# Patient Record
Sex: Male | Born: 1990 | Race: White | Hispanic: No | Smoking: Never smoker
Health system: Southern US, Community
[De-identification: ages and names within clinical notes are randomized; demographics above are authoritative.]

## PROBLEM LIST (undated history)

## (undated) ENCOUNTER — Emergency Department (HOSPITAL_COMMUNITY): Payer: BLUE CROSS/BLUE SHIELD

## (undated) DIAGNOSIS — J45909 Unspecified asthma, uncomplicated: Secondary | ICD-10-CM

## (undated) DIAGNOSIS — D649 Anemia, unspecified: Secondary | ICD-10-CM

## (undated) DIAGNOSIS — K219 Gastro-esophageal reflux disease without esophagitis: Secondary | ICD-10-CM

## (undated) HISTORY — PX: ELBOW SURGERY: SHX618

---
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
## 2017-08-26 ENCOUNTER — Other Ambulatory Visit: Payer: Self-pay | Admitting: Sports Medicine

## 2017-08-26 DIAGNOSIS — M2201 Recurrent dislocation of patella, right knee: Secondary | ICD-10-CM

## 2017-09-11 ENCOUNTER — Ambulatory Visit
Admission: RE | Admit: 2017-09-11 | Discharge: 2017-09-11 | Disposition: A | Discharge status: Home / Self Care | Payer: BLUE CROSS/BLUE SHIELD | Source: Ambulatory Visit | Attending: Sports Medicine | Admitting: Sports Medicine

## 2017-09-11 DIAGNOSIS — M2201 Recurrent dislocation of patella, right knee: Secondary | ICD-10-CM | POA: Diagnosis not present

## 2017-10-07 ENCOUNTER — Encounter: Payer: Self-pay | Admitting: *Deleted

## 2017-10-07 ENCOUNTER — Other Ambulatory Visit: Payer: Self-pay

## 2017-10-08 ENCOUNTER — Ambulatory Visit: Payer: Self-pay

## 2017-10-13 ENCOUNTER — Encounter
Admission: RE | Disposition: A | Discharge status: Home / Self Care | Payer: Self-pay | Source: Ambulatory Visit | Attending: Orthopedic Surgery

## 2017-10-13 ENCOUNTER — Ambulatory Visit: Payer: BLUE CROSS/BLUE SHIELD | Admitting: Anesthesiology

## 2017-10-13 ENCOUNTER — Ambulatory Visit
Admission: RE | Admit: 2017-10-13 | Discharge: 2017-10-13 | Disposition: A | Discharge status: Home / Self Care | Payer: BLUE CROSS/BLUE SHIELD | Source: Ambulatory Visit | Attending: Orthopedic Surgery | Admitting: Orthopedic Surgery

## 2017-10-13 DIAGNOSIS — M1711 Unilateral primary osteoarthritis, right knee: Secondary | ICD-10-CM | POA: Diagnosis not present

## 2017-10-13 DIAGNOSIS — M222X1 Patellofemoral disorders, right knee: Secondary | ICD-10-CM | POA: Diagnosis not present

## 2017-10-13 DIAGNOSIS — M2410 Other articular cartilage disorders, unspecified site: Secondary | ICD-10-CM | POA: Diagnosis not present

## 2017-10-13 DIAGNOSIS — M2201 Recurrent dislocation of patella, right knee: Secondary | ICD-10-CM | POA: Insufficient documentation

## 2017-10-13 DIAGNOSIS — Z9889 Other specified postprocedural states: Secondary | ICD-10-CM | POA: Diagnosis not present

## 2017-10-13 HISTORY — DX: Unspecified asthma, uncomplicated: J45.909

## 2017-10-13 HISTORY — PX: KNEE ARTHROSCOPY WITH LATERAL RELEASE: SHX5649

## 2017-10-13 HISTORY — PX: CHONDROPLASTY: SHX5177

## 2017-10-13 HISTORY — DX: Anemia, unspecified: D64.9

## 2017-10-13 HISTORY — DX: Gastro-esophageal reflux disease without esophagitis: K21.9

## 2017-10-13 SURGERY — ARTHROSCOPY, KNEE, WITH LATERAL RETINACULUM RELEASE
Anesthesia: Regional | Laterality: Right

## 2017-10-13 MED ORDER — FENTANYL CITRATE (PF) 100 MCG/2ML IJ SOLN: 25.0000 ug | INTRAMUSCULAR | Status: DC | PRN

## 2017-10-13 MED ORDER — HYDROCODONE-ACETAMINOPHEN 5-325 MG PO TABS: 1.0000 | ORAL_TABLET | ORAL | 0 refills | Status: DC | PRN

## 2017-10-13 MED ORDER — BUPIVACAINE HCL 0.5 % IJ SOLN
INTRAMUSCULAR | Status: DC | PRN
  Administered 2017-10-13: 30 mL

## 2017-10-13 MED ORDER — ASPIRIN EC 325 MG PO TBEC: 325.0000 mg | DELAYED_RELEASE_TABLET | Freq: Every day | ORAL | 0 refills | Status: AC

## 2017-10-13 MED ORDER — ONDANSETRON HCL 4 MG/2ML IJ SOLN
4.0000 mg | Freq: Once | INTRAMUSCULAR | Status: AC | PRN
  Administered 2017-10-13: 4 mg via INTRAVENOUS

## 2017-10-13 MED ORDER — DEXAMETHASONE SODIUM PHOSPHATE 4 MG/ML IJ SOLN
INTRAMUSCULAR | Status: DC | PRN
  Administered 2017-10-13: 4 mg via INTRAVENOUS

## 2017-10-13 MED ORDER — PROPOFOL 10 MG/ML IV BOLUS
INTRAVENOUS | Status: DC | PRN
  Administered 2017-10-13: 30 mg via INTRAVENOUS
  Administered 2017-10-13: 170 mg via INTRAVENOUS

## 2017-10-13 MED ORDER — LIDOCAINE HCL (CARDIAC) 20 MG/ML IV SOLN
INTRAVENOUS | Status: DC | PRN
  Administered 2017-10-13: 30 mg via INTRAVENOUS

## 2017-10-13 MED ORDER — METOCLOPRAMIDE HCL 5 MG/ML IJ SOLN
10.0000 mg | Freq: Once | INTRAMUSCULAR | Status: AC
  Administered 2017-10-13: 10 mg via INTRAVENOUS

## 2017-10-13 MED ORDER — DEXTROSE 5 % IV SOLN
2000.0000 mg | Freq: Once | INTRAVENOUS | Status: AC
  Administered 2017-10-13: 2000 mg via INTRAVENOUS

## 2017-10-13 MED ORDER — GLYCOPYRROLATE 0.2 MG/ML IJ SOLN
INTRAMUSCULAR | Status: DC | PRN
  Administered 2017-10-13: 0.1 mg via INTRAVENOUS

## 2017-10-13 MED ORDER — SUCCINYLCHOLINE CHLORIDE 20 MG/ML IJ SOLN
INTRAMUSCULAR | Status: DC | PRN
  Administered 2017-10-13: 100 mg via INTRAVENOUS

## 2017-10-13 MED ORDER — FENTANYL CITRATE (PF) 100 MCG/2ML IJ SOLN
INTRAMUSCULAR | Status: DC | PRN
  Administered 2017-10-13: 100 ug via INTRAVENOUS
  Administered 2017-10-13: 25 ug via INTRAVENOUS

## 2017-10-13 MED ORDER — ACETAMINOPHEN 10 MG/ML IV SOLN
1000.0000 mg | Freq: Once | INTRAVENOUS | Status: DC | PRN
  Administered 2017-10-13: 1000 mg via INTRAVENOUS

## 2017-10-13 MED ORDER — ONDANSETRON 4 MG PO TBDP: 4.0000 mg | ORAL_TABLET | Freq: Three times a day (TID) | ORAL | 0 refills | Status: DC | PRN

## 2017-10-13 MED ORDER — IBUPROFEN 800 MG PO TABS: 800.0000 mg | ORAL_TABLET | Freq: Three times a day (TID) | ORAL | 0 refills | Status: AC

## 2017-10-13 MED ORDER — LIDOCAINE-EPINEPHRINE 1 %-1:100000 IJ SOLN
INTRAMUSCULAR | Status: DC | PRN
  Administered 2017-10-13: 30 mL

## 2017-10-13 MED ORDER — OXYCODONE HCL 5 MG PO TABS: 5.0000 mg | ORAL_TABLET | Freq: Once | ORAL | Status: DC | PRN

## 2017-10-13 MED ORDER — OXYCODONE HCL 5 MG/5ML PO SOLN: 5.0000 mg | Freq: Once | ORAL | Status: DC | PRN

## 2017-10-13 MED ORDER — LACTATED RINGERS IV SOLN
1000.0000 mL | INTRAVENOUS | Status: DC
  Administered 2017-10-13: 1000 mL via INTRAVENOUS

## 2017-10-13 MED ORDER — LACTATED RINGERS IV SOLN: INTRAVENOUS | Status: DC

## 2017-10-13 MED ORDER — ONDANSETRON HCL 4 MG/2ML IJ SOLN
INTRAMUSCULAR | Status: DC | PRN
  Administered 2017-10-13: 4 mg via INTRAVENOUS

## 2017-10-13 SURGICAL SUPPLY — 47 items
ADAPTER IRRIG TUBE 2 SPIKE SOL (ADAPTER) ×6 IMPLANT
BLADE SURG 15 STRL LF DISP TIS (BLADE) ×1 IMPLANT
BLADE SURG 15 STRL SS (BLADE) ×2
BLADE SURG SZ11 CARB STEEL (BLADE) ×3 IMPLANT
BNDG ESMARK 6X12 TAN STRL LF (GAUZE/BANDAGES/DRESSINGS) IMPLANT
BUR RADIUS 3.5 (BURR) IMPLANT
BUR RADIUS 4.0X18.5 (BURR) ×3 IMPLANT
CHLORAPREP W/TINT 26ML (MISCELLANEOUS) ×3 IMPLANT
CLOSURE WOUND 1/2 X4 (GAUZE/BANDAGES/DRESSINGS) ×1
COOLER POLAR GLACIER W/PUMP (MISCELLANEOUS) ×3 IMPLANT
COVER LIGHT HANDLE UNIVERSAL (MISCELLANEOUS) ×6 IMPLANT
CUFF TOURN SGL QUICK 24 (TOURNIQUET CUFF) ×2
CUFF TOURN SGL QUICK 30 (MISCELLANEOUS)
CUFF TOURN SGL QUICK 34 (TOURNIQUET CUFF) ×2
CUFF TRNQT CYL 24X4X40X1 (TOURNIQUET CUFF) ×1 IMPLANT
CUFF TRNQT CYL 34X4X40X1 (TOURNIQUET CUFF) ×1 IMPLANT
CUFF TRNQT CYL LO 30X4X (MISCELLANEOUS) IMPLANT
DECANTER SPIKE VIAL GLASS SM (MISCELLANEOUS) ×3 IMPLANT
DRAPE IMP U-DRAPE 54X76 (DRAPES) ×3 IMPLANT
GAUZE SPONGE 4X4 12PLY STRL (GAUZE/BANDAGES/DRESSINGS) ×3 IMPLANT
GLOVE BIO SURGEON STRL SZ7.5 (GLOVE) ×3 IMPLANT
GLOVE BIOGEL PI IND STRL 8 (GLOVE) ×1 IMPLANT
GLOVE BIOGEL PI INDICATOR 8 (GLOVE) ×2
GOWN STRL REUS W/ TWL LRG LVL3 (GOWN DISPOSABLE) ×1 IMPLANT
GOWN STRL REUS W/TWL LRG LVL3 (GOWN DISPOSABLE) ×5 IMPLANT
IV LACTATED RINGER IRRG 3000ML (IV SOLUTION) ×12
IV LR IRRIG 3000ML ARTHROMATIC (IV SOLUTION) ×6 IMPLANT
KIT RM TURNOVER STRD PROC AR (KITS) ×3 IMPLANT
MAT BLUE FLOOR 46X72 FLO (MISCELLANEOUS) ×3 IMPLANT
NEEDLE HYPO 21X1.5 SAFETY (NEEDLE) ×3 IMPLANT
NEPTUNE MANIFOLD (MISCELLANEOUS) ×3 IMPLANT
PACK ARTHROSCOPY KNEE (MISCELLANEOUS) ×3 IMPLANT
PAD ABD DERMACEA PRESS 5X9 (GAUZE/BANDAGES/DRESSINGS) ×6 IMPLANT
PAD WRAPON POLAR KNEE (MISCELLANEOUS) ×1 IMPLANT
PADDING CAST 4IN STRL (MISCELLANEOUS) ×2
PADDING CAST BLEND 4X4 STRL (MISCELLANEOUS) ×1 IMPLANT
SET TUBE SUCT SHAVER OUTFL 24K (TUBING) ×3 IMPLANT
SET TUBE TIP INTRA-ARTICULAR (MISCELLANEOUS) ×3 IMPLANT
STRIP CLOSURE SKIN 1/2X4 (GAUZE/BANDAGES/DRESSINGS) ×2 IMPLANT
SUT ETHILON 3-0 FS-10 30 BLK (SUTURE) ×3
SUTURE EHLN 3-0 FS-10 30 BLK (SUTURE) ×1 IMPLANT
TOWEL OR 17X26 4PK STRL BLUE (TOWEL DISPOSABLE) ×6 IMPLANT
TUBING ARTHRO INFLOW-ONLY STRL (TUBING) ×3 IMPLANT
VERICEL CARTILAGE BIOPSY KIT ×3 IMPLANT
WAND HAND CNTRL MULTIVAC 50 (MISCELLANEOUS) IMPLANT
WAND HAND CNTRL MULTIVAC 90 (MISCELLANEOUS) ×3 IMPLANT
WRAPON POLAR PAD KNEE (MISCELLANEOUS) ×3

## 2017-10-13 NOTE — Anesthesia Procedure Notes (Signed)
Procedure Name: Intubation Date/Time: 10/13/2017 11:39 AM Performed by: Cameron Ali, CRNA Pre-anesthesia Checklist: Patient identified, Emergency Drugs available, Suction available, Patient being monitored and Timeout performed Patient Re-evaluated:Patient Re-evaluated prior to induction Oxygen Delivery Method: Circle system utilized Preoxygenation: Pre-oxygenation with 100% oxygen Induction Type: IV induction, Rapid sequence and Cricoid Pressure applied Laryngoscope Size: Mac and 3 Grade View: Grade II Tube type: Oral Tube size: 7.5 mm Number of attempts: 1 Placement Confirmation: ETT inserted through vocal cords under direct vision,  positive ETCO2 and breath sounds checked- equal and bilateral Tube secured with: Tape Dental Injury: Teeth and Oropharynx as per pre-operative assessment

## 2017-10-13 NOTE — H&P (Signed)
Paper H&P to be scanned into permanent record. H&P reviewed. No significant changes noted.  

## 2017-10-13 NOTE — OR Nursing (Signed)
[employer] rep. For Vericel,  Cartilage Biopsy Kit Patient consented.

## 2017-10-13 NOTE — Anesthesia Postprocedure Evaluation (Signed)
Anesthesia Post Note  Patient: Kyle [name]  Procedure(s) Performed: KNEE ARTHROSCOPY WITH LATERAL RELEASE POSSIBLE MICROFRACTURE AND MACI BIOPSY (Right ) CHONDROPLASTY (Right )  Patient location during evaluation: PACU Anesthesia Type: Regional Level of consciousness: awake and alert, oriented and patient cooperative Pain management: pain level controlled Vital Signs Assessment: post-procedure vital signs reviewed and stable Respiratory status: spontaneous breathing, nonlabored ventilation and respiratory function stable Cardiovascular status: blood pressure returned to baseline and stable Postop Assessment: adequate PO intake Anesthetic complications: no    Reed BreechAndrea Bert Ptacek

## 2017-10-13 NOTE — Discharge Instructions (Signed)
Arthroscopic Knee Surgery  Post-Op Instructions  1. Bracing or crutches: Crutches will be provided at the time of discharge from the surgery center if you do not already have them.  2. Ice: You may be provided with a device [facility]) that allows you to ice the affected area effectively. Otherwise you can ice manually.   3. Driving:  Plan on not driving for at least one week. Please note that you are advised NOT to drive while taking narcotic pain medications as you may be impaired and unsafe to drive.  4. Activity: Ankle pumps several times an hour while awake to prevent blood clots. Weight bearing: as tolerated. Use crutches for as needed (usually ~1 week or less) until pain allows you to ambulate without a limp. Bending and straightening the knee is unlimited. Elevate knee above heart level as much as possible for one week. Avoid standing more than 5 minutes (consecutively) for the first week.  Avoid long distance travel for 2 weeks.  5. Medications:  - You have been provided a prescription for narcotic pain medicine. After surgery, take 1-2 narcotic tablets every 4 hours if needed for severe pain.  - A prescription for anti-nausea medication will be provided in case the narcotic medicine causes nausea - take 1 tablet every 6 hours only if nauseated.  - Take ibuprofen 800 mg every 8 hours WITH food to reduce post-operative knee swelling. DO NOT STOP IBUPROFEN POST-OP UNTIL INSTRUCTED TO DO SO at first post-op office visit (10-14 days after surgery). However, please discontinue if you have any abdominal discomfort after taking this.  - Take enteric coated aspirin 325 mg once daily for 2 weeks to prevent blood clots.   6. Bandages: The physical therapist should change the bandages at the first post-op appointment. If needed, the dressing supplies have been provided to you.  7. Physical Therapy: 1-2 times per week for 6 weeks. Therapy typically starts on post operative Day 3 or 4. You  have been provided an order for physical therapy. The therapist will provide home exercises.  8. Work: May return to full work usually around 2 weeks after 1st post-operative visit. May do light duty/desk job in approximately 1-2 weeks when off of narcotics, pain is well-controlled, and swelling has decreased. Labor intensive jobs may require 4-6 weeks to return.    9. Post-Op Appointments: Your first post-op appointment will be with Dr. Allena Katz in approximately 2 weeks time.   If you find that they have not been scheduled please call the Orthopaedic Appointment front desk at [phone].       General Anesthesia, Adult, Care After These instructions provide you with information about caring for yourself after your procedure. Your health care provider may also give you more specific instructions. Your treatment has been planned according to current medical practices, but problems sometimes occur. Call your health care provider if you have any problems or questions after your procedure. What can I expect after the procedure? After the procedure, it is common to have:  Vomiting.  A sore throat.  Mental slowness.  It is common to feel:  Nauseous.  Cold or shivery.  Sleepy.  Tired.  Sore or achy, even in parts of your body where you did not have surgery.  Follow these instructions at home: For at least 24 hours after the procedure:  Do not: ? Participate in activities where you could fall or become injured. ? Drive. ? Use heavy machinery. ? Drink alcohol. ? Take sleeping pills or  medicines that cause drowsiness. ? Make important decisions or sign legal documents. ? Take care of children on your own.  Rest. Eating and drinking  If you vomit, drink water, juice, or soup when you can drink without vomiting.  Drink enough fluid to keep your urine clear or pale yellow.  Make sure you have little or no nausea before eating solid foods.  Follow the diet recommended  by your health care provider. General instructions  Have a responsible adult stay with you until you are awake and alert.  Return to your normal activities as told by your health care provider. Ask your health care provider what activities are safe for you.  Take over-the-counter and prescription medicines only as told by your health care provider.  If you smoke, do not smoke without supervision.  Keep all follow-up visits as told by your health care provider. This is important. Contact a health care provider if:  You continue to have nausea or vomiting at home, and medicines are not helpful.  You cannot drink fluids or start eating again.  You cannot urinate after 8-12 hours.  You develop a skin rash.  You have fever.  You have increasing redness at the site of your procedure. Get help right away if:  You have difficulty breathing.  You have chest pain.  You have unexpected bleeding.  You feel that you are having a life-threatening or urgent problem. This information is not intended to replace advice given to you by your health care provider. Make sure you discuss any questions you have with your health care provider. Document Released: 12/01/2000 Document Revised: 01/28/2016 Document Reviewed: 08/09/2015 Elsevier Interactive Patient Education  [employer]2018 [employer]

## 2017-10-13 NOTE — Progress Notes (Signed)
Assisted Reed BreechAndrea Mazzoni, ANMD with right, ultrasound guided, adductor canal block. Side rails up, monitors on throughout procedure. See vital signs in flow sheet. Tolerated Procedure well.

## 2017-10-13 NOTE — Transfer of Care (Signed)
Immediate Anesthesia Transfer of Care Note  Patient: Kyle [name]  Procedure(s) Performed: KNEE ARTHROSCOPY WITH LATERAL RELEASE POSSIBLE MICROFRACTURE AND MACI BIOPSY (Right ) REMOVAL OF LOOSE BODY (Right ) CHONDROPLASTY (Right ) RECONSTRUCTION MEDIAL PATELLOFERMORAL LIGAMENT USING SENITENDINESUS ALLEGRAFT POSSIBLE OSTEOCHONDRAL FRAGMENT FIXATION (Right )  Patient Location: PACU  Anesthesia Type: General, Regional  Level of Consciousness: awake, alert  and patient cooperative  Airway and Oxygen Therapy: Patient Spontanous Breathing and Patient connected to supplemental oxygen  Post-op Assessment: Post-op Vital signs reviewed, Patient's Cardiovascular Status Stable, Respiratory Function Stable, Patent Airway and No signs of Nausea or vomiting  Post-op Vital Signs: Reviewed and stable  Complications: No apparent anesthesia complications

## 2017-10-13 NOTE — Anesthesia Procedure Notes (Signed)
Anesthesia Regional Block: Adductor canal block   Pre-Anesthetic Checklist: ,, timeout performed, Correct Patient, Correct Site, Correct Laterality, Correct Procedure, Correct Position, site marked, Risks and benefits discussed,  Surgical consent,  Pre-op evaluation,  At surgeon's request and post-op pain management  Laterality: Right  Prep: chloraprep       Needles:  Injection technique: Single-shot  Needle Type: Echogenic Needle     Needle Length: 9cm  Needle Gauge: 21     Additional Needles:   Procedures:,,,, ultrasound used (permanent image in chart),,,,  Narrative:  Start time: 10/13/2017 10:21 AM End time: 10/13/2017 10:28 AM Injection made incrementally with aspirations every 5 mL.  Performed by: Personally  Anesthesiologist: Reed BreechMazzoni, Cecile Gillispie, MD  Additional Notes: Functioning IV was confirmed and monitors applied. Ultrasound guidance: relevant anatomy identified, needle position confirmed, local anesthetic spread visualized around nerve(s)., vascular puncture avoided.  Image printed for medical record.  Negative aspiration and no paresthesias; incremental administration of local anesthetic for a total of 20ml ropivacaine 0.5%. The patient tolerated the procedure well. Vitals signes recorded in RN notes.

## 2017-10-13 NOTE — Anesthesia Preprocedure Evaluation (Signed)
Anesthesia Evaluation  Patient identified by MRN, date of birth, ID band Patient awake    Reviewed: Allergy & Precautions, NPO status , Patient's Chart, lab work & pertinent test results  History of Anesthesia Complications Negative for: history of anesthetic complications  Airway Mallampati: I  TM Distance: >3 FB Neck ROM: Full    Dental no notable dental hx.    Pulmonary asthma (childhood) ,  Snoring    Pulmonary exam normal breath sounds clear to auscultation       Cardiovascular Exercise Tolerance: Good negative cardio ROS Normal cardiovascular exam Rhythm:Regular Rate:Normal     Neuro/Psych negative neurological ROS     GI/Hepatic GERD  Poorly Controlled,  Endo/Other  negative endocrine ROS  Renal/GU negative Renal ROS     Musculoskeletal   Abdominal   Peds  Hematology  (+) Blood dyscrasia, anemia ,   Anesthesia Other Findings   Reproductive/Obstetrics                             Anesthesia Physical Anesthesia Plan  ASA: II  Anesthesia Plan: General and Regional   Post-op Pain Management:  Regional for Post-op pain and GA combined w/ Regional for post-op pain   Induction: Intravenous  PONV Risk Score and Plan: 2 and Dexamethasone and Ondansetron  Airway Management Planned: Oral ETT  Additional Equipment:   Intra-op Plan:   Post-operative Plan: Extubation in OR  Informed Consent: I have reviewed the patients History and Physical, chart, labs and discussed the procedure including the risks, benefits and alternatives for the proposed anesthesia with the patient or authorized representative who has indicated his/her understanding and acceptance.     Plan Discussed with: CRNA  Anesthesia Plan Comments: (RSI for active GERD symptoms)        Anesthesia Quick Evaluation

## 2017-10-13 NOTE — Op Note (Signed)
Operative Note   SURGERY DATE: 10/13/2017  PRE-OP DIAGNOSIS:  1. 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 Right patella lateral tilt  POST-OP DIAGNOSIS:  1. 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 Right patella lateral tilt  PROCEDURES:  1. Right patella chondroplasty 2. Right knee autologous chondrocyte biopsy 3. Right arthroscopic lateral release  SURGEON: Rosealee AlbeeSunny H. Getsemani Lindon, MD  ANESTHESIA: Gen  ESTIMATED BLOOD LOSS:minimal  TOTAL IV FLUIDS: per anesthesia  INDICATION(S):  Kyle [name] is a 27 y.o. male with a history of six prior patellar dislocations. The most recent one occurred on 08/16/17. This one has been the most severe in terms of swelling and pain as well as difficulty with reduction. Imaging showed osteochondral fragments about the patella as well as disrupted MPFL and cartilage defect of medial patella. We had planned for R knee arthroscopy, lateral release, MPFL reconstruction, and addressing of the cartilage lesion with microfracture vs. Osteochondral fragment fixation. We did discuss obtaining MACI biopsy and consent for this was also obtained. Additionally, preoperatively, we discussed that if his cartilage lesion was more extensive, we would plan to do a two stage procedure with MACI implantation and MPFL reconstruction as the second stage.  After discussion of risks, benefits, and alternatives to surgery, the patient elected to proceed with this plan.   OPERATIVE FINDINGS:   Examination under anesthesia:A careful examination under anesthesia was performed. Passive range of motion was: Hyperextension: 7. Extension: 0. Flexion: 140. Lachman: normal. Pivot Shift: normal. Posterior drawer: normal. Varus stability in full extension: normal. Varus stability in 30 degrees of flexion: normal. Valgus stability in full extension: normal. Valgus stability in 30 degrees of flexion: normal. Patella  with lateral tilt and 3+ lateral quadrants of motion with 2+ medial quadrant of motion. He also had a positive "J" sign.   Intra-operative findings:A thorough arthroscopic examination of the knee was performed. The findings are: 1. Suprapatellar pouch: Normal with small loose osteochondral fragment. 2. Undersurface of median ridge: Normal 3. Medial patellar facet: Grade 4 degenerative changes ~4x225mm on medial border. Additional area of ~1.5x~1.2cm of Grade 3-4 cartilage softening over apex of patella extending medially and located centrally in the superior/inferior direction. 4. Lateral patellar facet: Significant lateral overhang of lateral patella compared to lateral border of trochlea.  5. Trochlea: Normal cartilage, abnormal tracking of patella 6. Lateral gutter/popliteus tendon: Normal 7. Hoffa's fat pad: Inflamed 8. Medial gutter/plica: Normal 9. ACL: Normal 10. PCL: Normal 11. Medial meniscus: Normal 12. Medial compartment cartilage: Normal 13. Lateral meniscus: Normal 14. Lateral compartment cartilage: Normal  OPERATIVE REPORT:   I identified Kyle BraceJacob Beesonin the pre-operative holding area. I marked theoperativeknee with my initials. I reviewed the risks and benefits of the proposed surgical intervention and the patient (and/or patient's guardian) wished to proceed. The patient was transferred to the operative suite and placed in the supine position with all bony prominences padded. Anesthesia was administered. Appropriate IV antibioticswere administered prior to incision. The extremity was then prepped and draped in standard fashion. A time out was performed confirming the correct extremity, correct patient, and correct procedure.  Arthroscopy portals were marked. Local anesthetic was injected to the planned portal sites. The anterolateral portalwasestablished with an 11blade. The arthroscope was placed in the anterolateral portal and theninto the suprapatellar pouch. A  diagnostic knee scope was completed with the above findings.   Next the medial portal was established under needle localization. The medial patellar border cartilage defect was identified.  A chondroplasty  of the medial border of the patella was performed such that there were stable cartilage edges without any loose fragments of cartilage. The loose body was identified and removed with a shaver. The cartilage surface of the patella was probed and the aforementioned cartilage defect was observed. Given the extent and severity of cartilage damage in the central weightbearing portion, I elected to perform a MACI implantation and MPFL reconstruction as a second stage procedure as I had discussed with the patient prior to surgery.   I then placed an Arthrocare wand into the joint and performed a lateral release of the lateral patellar retinaculum until muscle fibers were visualized. This was carried down from approximately the superior pole of the patella to near the portal entrance site. The patella now sat more centrally in the groove and the lateral overhang was improved. The tourniquet was let down and no major bleeding was noted.  Arthroscopic fluid was removed from the joint.  The portals were closed with 3-0 Nylon suture. Sterile dressings included Xeroform, 4x4s, Sof-Rol, and Bias wrap. A Polarcare was placed.  The patient was then awakened and taken to the PACU hemodynamically stable without complication.   POSTOPERATIVE PLAN: The patient will be discharged home today once they meet PACU criteria. Aspirin 325 mg daily was prescribed for 2 weeks for DVT prophylaxis. Physical therapy will start on POD#3-4.Weight-bearing as tolerated. They will follow up in 2 weeks per protocol.   This will serve as stage 1 (MACI biopsy, chondroplasty and lateral release) of a two stage procedure. Second stage will consist of Right patella MACI implantation and concurrent MPFL reconstruction. We will plan to do  this in ~6 weeks after confirmation that autologous chondrocytes can be grown and is complete.

## 2017-11-16 ENCOUNTER — Other Ambulatory Visit: Payer: Self-pay

## 2017-11-16 ENCOUNTER — Encounter: Payer: Self-pay | Admitting: *Deleted

## 2017-12-03 ENCOUNTER — Encounter
Admission: RE | Disposition: A | Discharge status: Home / Self Care | Payer: Self-pay | Source: Ambulatory Visit | Attending: Orthopedic Surgery

## 2017-12-03 ENCOUNTER — Ambulatory Visit: Payer: BLUE CROSS/BLUE SHIELD | Admitting: Anesthesiology

## 2017-12-03 ENCOUNTER — Ambulatory Visit
Admission: RE | Admit: 2017-12-03 | Discharge: 2017-12-03 | Disposition: A | Discharge status: Home / Self Care | Payer: BLUE CROSS/BLUE SHIELD | Source: Ambulatory Visit | Attending: Orthopedic Surgery | Admitting: Orthopedic Surgery

## 2017-12-03 ENCOUNTER — Ambulatory Visit: Payer: BLUE CROSS/BLUE SHIELD | Attending: Orthopedic Surgery

## 2017-12-03 DIAGNOSIS — M948X6 Other specified disorders of cartilage, lower leg: Secondary | ICD-10-CM | POA: Insufficient documentation

## 2017-12-03 DIAGNOSIS — M25561 Pain in right knee: Secondary | ICD-10-CM | POA: Diagnosis present

## 2017-12-03 DIAGNOSIS — Z419 Encounter for procedure for purposes other than remedying health state, unspecified: Secondary | ICD-10-CM | POA: Diagnosis not present

## 2017-12-03 DIAGNOSIS — M2351 Chronic instability of knee, right knee: Secondary | ICD-10-CM | POA: Insufficient documentation

## 2017-12-03 HISTORY — PX: KNEE ARTHROSCOPY: SHX127

## 2017-12-03 SURGERY — ARTHROSCOPY, KNEE
Anesthesia: General | Site: Knee | Laterality: Right | Wound class: "Clean "

## 2017-12-03 MED ORDER — CEFAZOLIN SODIUM-DEXTROSE 2-4 GM/100ML-% IV SOLN
2.0000 g | Freq: Once | INTRAVENOUS | Status: AC
  Administered 2017-12-03: 2 g via INTRAVENOUS

## 2017-12-03 MED ORDER — LIDOCAINE HCL (CARDIAC) 20 MG/ML IV SOLN
INTRAVENOUS | Status: DC | PRN
  Administered 2017-12-03: 40 mg via INTRAVENOUS

## 2017-12-03 MED ORDER — LACTATED RINGERS IV SOLN
INTRAVENOUS | Status: DC
  Administered 2017-12-03 (×2): via INTRAVENOUS

## 2017-12-03 MED ORDER — SODIUM CHLORIDE 0.9 % IJ SOLN
INTRAMUSCULAR | Status: DC | PRN
  Administered 2017-12-03: 13:00:00

## 2017-12-03 MED ORDER — PROPOFOL 10 MG/ML IV BOLUS
INTRAVENOUS | Status: DC | PRN
  Administered 2017-12-03: 300 mg via INTRAVENOUS

## 2017-12-03 MED ORDER — BUPIVACAINE HCL (PF) 0.25 % IJ SOLN
INTRAMUSCULAR | Status: DC | PRN
  Administered 2017-12-03: 10 mL

## 2017-12-03 MED ORDER — OXYCODONE HCL 5 MG/5ML PO SOLN: 5.0000 mg | Freq: Once | ORAL | Status: AC | PRN

## 2017-12-03 MED ORDER — DEXAMETHASONE SODIUM PHOSPHATE 4 MG/ML IJ SOLN
INTRAMUSCULAR | Status: DC | PRN
  Administered 2017-12-03: 8 mg via INTRAVENOUS

## 2017-12-03 MED ORDER — ONDANSETRON 4 MG PO TBDP: 4.0000 mg | ORAL_TABLET | Freq: Three times a day (TID) | ORAL | 0 refills | Status: AC | PRN

## 2017-12-03 MED ORDER — DEXMEDETOMIDINE HCL 200 MCG/2ML IV SOLN
INTRAVENOUS | Status: DC | PRN
  Administered 2017-12-03: 4 ug via INTRAVENOUS
  Administered 2017-12-03 (×2): 8 ug via INTRAVENOUS
  Administered 2017-12-03: 4 ug via INTRAVENOUS

## 2017-12-03 MED ORDER — FENTANYL CITRATE (PF) 100 MCG/2ML IJ SOLN
INTRAMUSCULAR | Status: DC | PRN
  Administered 2017-12-03 (×2): 25 ug via INTRAVENOUS
  Administered 2017-12-03: 50 ug via INTRAVENOUS

## 2017-12-03 MED ORDER — BUPIVACAINE LIPOSOME 1.3 % IJ SUSP
INTRAMUSCULAR | Status: DC | PRN
  Administered 2017-12-03: 10 mL

## 2017-12-03 MED ORDER — OXYCODONE HCL 5 MG PO TABS
5.0000 mg | ORAL_TABLET | Freq: Once | ORAL | Status: AC | PRN
  Administered 2017-12-03: 5 mg via ORAL

## 2017-12-03 MED ORDER — ROPIVACAINE HCL 5 MG/ML IJ SOLN
INTRAMUSCULAR | Status: DC | PRN
  Administered 2017-12-03: 150 mg via PERINEURAL

## 2017-12-03 MED ORDER — PROMETHAZINE HCL 25 MG/ML IJ SOLN
6.2500 mg | Freq: Once | INTRAMUSCULAR | Status: AC
  Administered 2017-12-03: 6.25 mg via INTRAVENOUS

## 2017-12-03 MED ORDER — MIDAZOLAM HCL 5 MG/5ML IJ SOLN
INTRAMUSCULAR | Status: DC | PRN
  Administered 2017-12-03: 2 mg via INTRAVENOUS

## 2017-12-03 MED ORDER — FENTANYL CITRATE (PF) 100 MCG/2ML IJ SOLN: 25.0000 ug | INTRAMUSCULAR | Status: DC | PRN

## 2017-12-03 MED ORDER — ASPIRIN EC 325 MG PO TBEC: 325.0000 mg | DELAYED_RELEASE_TABLET | Freq: Every day | ORAL | 0 refills | Status: AC

## 2017-12-03 MED ORDER — TISSEEL VH 4 ML EX KIT
PACK | CUTANEOUS | Status: DC | PRN
  Administered 2017-12-03: 1 via TOPICAL

## 2017-12-03 MED ORDER — OXYCODONE HCL 5 MG PO TABS: 5.0000 mg | ORAL_TABLET | ORAL | 0 refills | Status: AC | PRN

## 2017-12-03 MED ORDER — ONDANSETRON HCL 4 MG/2ML IJ SOLN
4.0000 mg | Freq: Once | INTRAMUSCULAR | Status: AC | PRN
  Administered 2017-12-03: 4 mg via INTRAVENOUS

## 2017-12-03 MED ORDER — SUCCINYLCHOLINE CHLORIDE 20 MG/ML IJ SOLN
INTRAMUSCULAR | Status: DC | PRN
  Administered 2017-12-03: 100 mg via INTRAVENOUS

## 2017-12-03 MED ORDER — IBUPROFEN 800 MG PO TABS: 800.0000 mg | ORAL_TABLET | Freq: Three times a day (TID) | ORAL | 0 refills | Status: AC

## 2017-12-03 MED ORDER — ONDANSETRON HCL 4 MG/2ML IJ SOLN
INTRAMUSCULAR | Status: DC | PRN
  Administered 2017-12-03 (×2): 4 mg via INTRAVENOUS

## 2017-12-03 MED ORDER — ACETAMINOPHEN 500 MG PO TABS: 1000.0000 mg | ORAL_TABLET | Freq: Three times a day (TID) | ORAL | 2 refills | Status: AC

## 2017-12-03 SURGICAL SUPPLY — 54 items
ANCHOR ALL-SUT Q-FIX 2.8 (Anchor) ×2 IMPLANT
ANCHOR SUT 1.8 QFIX MINI (Orthopedic Implant) ×1 IMPLANT
ANCHOR SUT 1.8MM QFIX MINI (Orthopedic Implant) ×1 IMPLANT
APPLICATOR COTTON TIP WD 3 STR (MISCELLANEOUS) ×2 IMPLANT
BLADE SURG 15 STRL LF DISP TIS (BLADE) ×1 IMPLANT
BLADE SURG 15 STRL SS (BLADE) ×6
BLADE SURG SZ10 CARB STEEL (BLADE) ×4 IMPLANT
BNDG ESMARK 6X12 TAN STRL LF (GAUZE/BANDAGES/DRESSINGS) ×2 IMPLANT
BOWL GRADUATED 16OZ PLASTIC (MISCELLANEOUS) ×4 IMPLANT
CHLORAPREP W/TINT 26ML (MISCELLANEOUS) ×3 IMPLANT
COVER LIGHT HANDLE UNIVERSAL (MISCELLANEOUS) ×6 IMPLANT
CUFF TOURN SGL QUICK 30 (MISCELLANEOUS) ×2
CUFF TRNQT CYL LO 30X4X (MISCELLANEOUS) IMPLANT
DERMABOND ADVANCED (GAUZE/BANDAGES/DRESSINGS) ×2
DERMABOND ADVANCED .7 DNX12 (GAUZE/BANDAGES/DRESSINGS) IMPLANT
DRAPE C-ARM XRAY 36X54 (DRAPES) ×2 IMPLANT
DRAPE IMP U-DRAPE 54X76 (DRAPES) ×3 IMPLANT
DRAPE INCISE IOBAN 66X45 STRL (DRAPES) ×2 IMPLANT
DRSG OPSITE POSTOP 4X6 (GAUZE/BANDAGES/DRESSINGS) ×2 IMPLANT
DRSG OPSITE POSTOP 4X8 (GAUZE/BANDAGES/DRESSINGS) ×2 IMPLANT
FLEXI GRAFT SEMI-TENDINOSUS TENDON (Tissue) ×2 IMPLANT
GLOVE BIO SURGEON STRL SZ7.5 (GLOVE) ×11 IMPLANT
GLOVE BIOGEL PI IND STRL 8 (GLOVE) ×1 IMPLANT
GLOVE BIOGEL PI INDICATOR 8 (GLOVE) ×8
GOWN STRL REIN 2XL LVL4 (GOWN DISPOSABLE) ×6 IMPLANT
GOWN STRL REUS W/ TWL LRG LVL3 (GOWN DISPOSABLE) ×1 IMPLANT
GOWN STRL REUS W/TWL LRG LVL3 (GOWN DISPOSABLE) ×2
KIT ANCHOR SUT 1.8 Q-FIX MINI (KITS) ×2 IMPLANT
KIT SUTURE 2.8 Q-FIX DISP (MISCELLANEOUS) ×2 IMPLANT
KIT TURNOVER KIT A (KITS) ×3 IMPLANT
MACI AUTOLOGOUS CELL SCAFFOLD (Tissue) ×3 IMPLANT
MAT BLUE FLOOR 46X72 FLO (MISCELLANEOUS) ×3 IMPLANT
NDL KEITH 2.78 (NEEDLE) ×2 IMPLANT
NDL MAYO CATGUT SZ5 (NEEDLE) ×2
NDL SUT 5 .5 CRC TPR PNT MAYO (NEEDLE) IMPLANT
PACK ARTHROSCOPY KNEE (MISCELLANEOUS) ×3 IMPLANT
PADDING CAST 4IN STRL (MISCELLANEOUS) ×2
PADDING CAST BLEND 4X4 STRL (MISCELLANEOUS) ×1 IMPLANT
PADDING CAST BLEND 6X4 STRL (MISCELLANEOUS) IMPLANT
PADDING STRL CAST 6IN (MISCELLANEOUS) ×2
PATTIES SURGICAL .5 X.5 (GAUZE/BANDAGES/DRESSINGS) ×2 IMPLANT
REAMER LO PROFILE 8 (MISCELLANEOUS) ×2 IMPLANT
SCAFFOLD CELL AUTOLOGOUS MACI (Tissue) IMPLANT
SCREW BIO INTER 8X23 (Screw) ×2 IMPLANT
SPONGE LAP 18X18 5 PK (GAUZE/BANDAGES/DRESSINGS) ×2 IMPLANT
SUT 2 FIBERLOOP 20 STRT BLUE (SUTURE) ×6
SUT MNCRL 4-0 (SUTURE) ×6
SUT MNCRL 4-0 27XMFL (SUTURE) ×3
SUT VIC AB 0 CT1 36 (SUTURE) ×4 IMPLANT
SUT VIC AB 1 CT1 36 (SUTURE) ×4 IMPLANT
SUT VIC AB 2-0 CT2 27 (SUTURE) ×4 IMPLANT
SUTURE 2 FIBERLOOP 20 STRT BLU (SUTURE) IMPLANT
SUTURE MNCRL 4-0 27XMF (SUTURE) IMPLANT
SYR BULB IRRIG 60ML STRL (SYRINGE) ×2 IMPLANT

## 2017-12-03 NOTE — Op Note (Signed)
OPERATIVE NOTE  12/03/2017  PRE-OP DIAGNOSIS:  1. 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 Right patella matrix autologous chondrocyte implantation (MACI) 2. Right knee MPFL reconstruction  SURGEON:Elgene Coral Molinda BailiffH. Amarian Botero, MD  ASSISTANT(S): none  ANESTHESIA: regional block + Gen  TOTAL IV FLUIDS: see anesthesia record  ESTIMATED BLOOD LOSS: 25cc  TOURNIQUET TIME: 130 min  DRAINS: none  SPECIMENS: None.  IMPLANTS: Arthrex 8x6723mm interference screw, Q-fix anchors in patella x2  COMPLICATIONS: None apparent.  INDICATIONS: Kyle [name] is a 27 y.o. male with Right knee pain and patellar instability. Subsequent MRI showed a full-thickness chondral defect of the patella with underlying reactive bone marrow edema. Prior arthroscopy demonstrated full-thickness patellar chondral lesion with extensive surrounding cartilage softening. The patient had normal patellar alignment with normal TT-TG distance. Given the patient's activity level and full-thickness large chondral defect, surgery was recommended for autologous chondrocyte implantation of the patella and MPFL reconstruction to avoid recurrent patellar instability..  DETAILS OF PROCEDURE: After satisfactory right lower extremity regional block was achieved in the preoperative holding area, the patient was brought to the operating room.  He was placed supine on the OR table.  A bump was placed under the hip and a tourniquet was placed on the thigh. The operative extremity was prescrubbed with Hibiclens and alcohol, prepped with ChloraPrep, and draped in the usual sterile fashion. The patient was given preoperative IV antibiotics within 30 minutes of the start of the case and a surgical time-out confirming patient identity, procedure, and laterality was performed.   A longitudinal incision was marked from just proximal  patella to the tibial tubercle.  The leg was elevated and exsanguinated with an Esmarch bandage and tourniquet inflated to 250 mmHg.  A 10 blade was used to make the standard midline incision.  Medial and lateral flaps were developed.    Next, Layer 2 was dissected free from Layer 3 (capsule) at the medial aspect of the mid-patella and tagged with Vicryl suture. A blunt Tresa EndoKelly was passed down to the region of the MPFL origin between the adductor tubercle and medial epicondyle.   A medial parapatellar arthrotomy was performed from just proximal to the patella through the quadriceps tendon along the patella down to the insertion of the patellar tendon on the tubercle.  The patella was then everted.  The patella was inspected and noted to have a grade 4 lesion on the medial facet of the patella. Surrounding cartilage was significantly damaged and extended to the lateral facet. There were no focal lesions of the trochlea that required treatment. An oval 15mm x 23mm sizer appropriately fit the defect, and this was imprinted into the cartilage. Small rongeur and Ring curette was used to remove the damaged cartilage. The lesion(s) was well shouldered with vertical walls. The defect measured 1.5cm (proximal-distal) x 2.3cm (medial-lateral). The tourniquet was lowered and appropriate hemostasis of the defect was achieved. The wound was thoroughly irrigated. The membrane was placed cell side up over foil and cut to size using the 15x7723mm sizer.   Tisseel glue was applied over the subchondral bone. The membrane with chondrocytes was placed over the bone defect, cell side down toward bone. Tisseel glue was applied to the edges of the lesion/membrane. The membrane was held in this position for 3 minutes to allow the Tisseel glue to set.   Then, the MPFL reconstruction was performed. Using bovie electrocautery, dissection onto  the medial surface of the patella was performed. A rongeur was used to create a trough at the  midportion of the patella. Using fluoroscopy, two Q-fix mini all suture anchors were placed into the trough created on the patella. The first was placed at the midportion of the patella in the superior/inferior aspect, and the second was placed ~2cm superior near the angle of the patella. Next, Schottle's point was localized with fluoroscopy and an incision was made over it. Dissection was carried down with bovie electrocautery and fascia was incised longitudinally. A guidepin was placed at Schottle's point and brought through the lateral femoral cortex. The semitendinosus allograft was prepared by doubling it over and using a FiberLoop over 25mm of the doubled portion with two free ends of the graft. The doubled over portion measured 8cm in diameter. Therefore, an 8mm low-profile reamer was used to drill over the guidepin in the femur to a depth of 25mm. The graft was then docked into the tunnel by passing the free sutures through the eyelet of the guidepin and passing the guidepin out through the lateral side. An Arthrex 8x25mm interference screw was then placed over a guidewire. The graft was pulled and it held firmly in place. A Kelly clamp was passed in between layers 2 and 3 to retreive both free ends of the graft. The grafts were held at a resting tension. One limb of the superior patella Q-fix anchor was passed in a locking fashion through one free end of the graft and the second limb was passed through the graft in a simple fashion. This limb became the post and was used to shuttle the graft down to the anchor. This was then tied with alternating half-hitches. Similarly, this was performed for the more inferior Q-fix and the other free end of the graft. This achieved appropriate constraint of the patella. The knee was taken through a range of motion and full hyperextension and flexion could be achieved. There was no patellar mal-tracking. Patella could not be dislocated laterally.  All wounds were  thoroughly irrigated. The arthrotomy was closed with #1 Vicryl.  2-0 Vicryl and 4-0 Monocryl were used to close the subdermal layer and skin of the midline incision.  Similarly, the medial femoral incision was closed with 0-Vicryl for the deep fascial layer, 2-0 Vicryl, and 4-0 Monocryl. Dermabond was applied. Leg was wrapped in cotton and bias wrap. Hinged knee brace locked at 0 degrees were applied.  The patient was brought to PACU in stable condition.  Instrument, sponge, and needle counts were correct prior to wound closure and at the conclusion of the case.  Of note, services of a PA were essential to performing the surgery. PA was able to assist in patient positioning, exposure, retraction, and suturing the wound.    POSTOPERATIVE PLAN: The patient will be discharged home today from PACU.   Follow patellofemoral MACI rehab protocol. FFWB and CPM from 0-20 for 1st 2 weeks.  PT on POD#3-4.  Return to clinic in ~2 weeks.

## 2017-12-03 NOTE — Anesthesia Procedure Notes (Signed)
Procedure Name: Intubation Date/Time: 12/03/2017 11:35 AM Performed by: Janna Arch, CRNA Pre-anesthesia Checklist: Patient identified, Emergency Drugs available, Suction available and Patient being monitored Patient Re-evaluated:Patient Re-evaluated prior to induction Oxygen Delivery Method: Nasal cannula Preoxygenation: Pre-oxygenation with 100% oxygen Induction Type: IV induction Ventilation: Mask ventilation without difficulty Laryngoscope Size: Mac and 3 Grade View: Grade I Tube type: Oral Tube size: 7.5 mm Number of attempts: 1 Airway Equipment and Method: Stylet Dental Injury: Teeth and Oropharynx as per pre-operative assessment

## 2017-12-03 NOTE — Transfer of Care (Deleted)
Immediate Anesthesia Transfer of Care Note  Patient: Kyle [name]  Procedure(s) Performed: RIGHT  KNEE MACI IMPLANTATION  MPFL RECONSTRUCTION (Right Knee)  Patient Location: PACU  Anesthesia Type: General, General ETT  Level of Consciousness: awake, alert  and patient cooperative  Airway and Oxygen Therapy: Patient Spontanous Breathing and Patient connected to supplemental oxygen  Post-op Assessment: Post-op Vital signs reviewed, Patient's Cardiovascular Status Stable, Respiratory Function Stable, Patent Airway and No signs of Nausea or vomiting  Post-op Vital Signs: Reviewed and stable  Complications: No apparent anesthesia complications  

## 2017-12-03 NOTE — Transfer of Care (Signed)
Immediate Anesthesia Transfer of Care Note  Patient: Alison MurrayJacob Sliwinski  Procedure(s) Performed: RIGHT  KNEE MACI IMPLANTATION  MPFL RECONSTRUCTION (Right Knee)  Patient Location: PACU  Anesthesia Type: General, General ETT  Level of Consciousness: awake, alert  and patient cooperative  Airway and Oxygen Therapy: Patient Spontanous Breathing and Patient connected to supplemental oxygen  Post-op Assessment: Post-op Vital signs reviewed, Patient's Cardiovascular Status Stable, Respiratory Function Stable, Patent Airway and No signs of Nausea or vomiting  Post-op Vital Signs: Reviewed and stable  Complications: No apparent anesthesia complications

## 2017-12-03 NOTE — Anesthesia Procedure Notes (Addendum)
Anesthesia Regional Block: Femoral nerve block   Pre-Anesthetic Checklist: ,, timeout performed, Correct Patient, Correct Site, Correct Laterality, Correct Procedure, Correct Position, site marked, Risks and benefits discussed,  Surgical consent,  Pre-op evaluation,  At surgeon's request and post-op pain management  Laterality: Right  Prep: chloraprep       Needles:  Injection technique: Single-shot  Needle Type: Echogenic Needle     Needle Length: 9cm  Needle Gauge: 21     Additional Needles:   Procedures:,,,, ultrasound used (permanent image in chart),,,,  Narrative:  Start time: 12/03/2017 11:09 AM End time: 12/03/2017 11:17 AM Injection made incrementally with aspirations every 5 mL.  Performed by: Personally  Anesthesiologist: Jolayne Pantherunkle, Imara Standiford, MD  Additional Notes: Functioning IV was confirmed and monitors applied. Ultrasound guidance: relevant anatomy identified, needle position confirmed, local anesthetic spread visualized around nerve(s)., vascular puncture avoided.  Image printed for medical record.  Negative aspiration and no paresthesias; incremental administration of local anesthetic. The patient tolerated the procedure well. Vitals signes recorded in RN notes.

## 2017-12-03 NOTE — Anesthesia Postprocedure Evaluation (Signed)
Anesthesia Post Note  Patient: Kyle MurrayJacob [name]  Procedure(s) Performed: RIGHT  KNEE MACI IMPLANTATION  MPFL RECONSTRUCTION (Right Knee)  Patient location during evaluation: PACU Anesthesia Type: General Level of consciousness: awake and alert Pain management: pain level controlled Vital Signs Assessment: post-procedure vital signs reviewed and stable Respiratory status: spontaneous breathing Cardiovascular status: blood pressure returned to baseline Postop Assessment: no headache Anesthetic complications: no    Verner Cholunkle, III,  Ronae Noell D

## 2017-12-03 NOTE — Discharge Instructions (Signed)
Post-Op Instructions  1. Bracing or crutches: You will be provided with a long brace (from hip to ankle) and crutches at the surgery [name]. It should remain locked in extension at all times except for when performing RoM exercises or CPM. CPM machine settings: 0-20 for 1st two weeks.   2. Ice: You will be provided with a device [name] [name](Polar Care) that allows you to ice the affected area effectively.   3. Showering: Incision must remain dry for 5 days. Afterwards, you may shower and gently pat incision dry.  NO submerging wound for 4 weeks. There is an absorbable stitch and you may see the ends of the stitch. If applicable, these will be removed at your first post-operative appointment in 2 weeks.   4. Driving: You will be given specific driving precautions at discharge. Plan on not driving for at least one week for left knee surgery, and 4 weeks for right knee surgery if you are restricted due to the brace and knee motion. Please note that you are advised NOT to drive while taking narcotic pain medications as you may be impaired and unsafe to drive.  5. Activity: Weight bearing: Flat foot weight bearing (weight of leg for balance only). Bending the knee is limited and will be guided by the physical therapist (0-20 degrees for the first 2 weeks). Elevate knee above heart level as much as possible for one week. Avoid standing more than 5 minutes (consecutively) for the first week. No exercise involving the knee until cleared by the surgeon or physical therapist.  Avoid long distance travel for 4 weeks.  6. Medications: - You have been provided a prescription for narcotic pain medicine. After surgery, take 1-2 narcotic tablets every 4 hours if needed for severe pain.  - A prescription for anti-nausea medication will be provided in case the narcotic medicine causes nausea - take 1 tablet every 6 hours only if nauseated.  - Take enteric coated aspirin 325 mg once daily for 4 weeks to prevent blood clots.    -Take tylenol 1000 every 8 hours for pain.  May stop tylenol when you are having minimal pain. - Take ibuprofen 800mg  3x/day with food every day for at least 2 weeks.    If you are taking prescription medication for anxiety, depression, insomnia, muscle spasm, chronic pain, or for attention deficit disorder, you are advised that you are at a higher risk of adverse effects with use of narcotics post-op, including narcotic addiction/dependence, depressed breathing, death. If you use non-prescribed substances: alcohol, marijuana, cocaine, heroin, methamphetamines, etc., you are at a higher risk of adverse effects with use of narcotics post-op, including narcotic addiction/dependence, depressed breathing, death. You are advised that taking > 50 morphine milligram equivalents (MME) of narcotic pain medication per day results in twice the risk of overdose or death. For your prescription provided: oxycodone 5 mg - taking more than 6 tablets per day would result in > 50 morphine milligram equivalents (MME) of narcotic pain medication. Be advised that we will prescribe narcotics short-term, for acute post-operative pain, only 3 weeks for major operations such as knee repair/reconstruction surgeries.   7. Bandages: The physical therapist should change the bandages at the first post-op appointment. If needed, the dressing supplies have been provided to you.  8. Physical Therapy: 2 times per week for the first 4 weeks, then 1-2 times per week from weeks 4-8 post-op. Therapy typically starts on post operative Day 3 or 4. You have been provided an order for physical  therapy today and should schedule your appointments in advance to avoid delay. The therapist will provide home exercises.  9. Work or School: For most, but not all procedures, we advise staying out of work or school for at least 1 to 2 weeks in order to recover from the stress of surgery and to allow time for healing and swelling control. If you need a  work or school note this can be provided.   10. Post-Op Appointments: Your first post-op appointment will be with Dr. Allena Katz in approximately 2 weeks time. Please double check if this will be at the [facility] facility (Tuesdays and Thursdays) or [facility] facility (Wednesdays).   If you find that they have not been scheduled please call the Orthopaedic Appointment front desk at [phone].     General Anesthesia, Adult, Care After These instructions provide you with information about caring for yourself after your procedure. Your health care provider may also give you more specific instructions. Your treatment has been planned according to current medical practices, but problems sometimes occur. Call your health care provider if you have any problems or questions after your procedure. What can I expect after the procedure? After the procedure, it is common to have:  Vomiting.  A sore throat.  Mental slowness.  It is common to feel:  Nauseous.  Cold or shivery.  Sleepy.  Tired.  Sore or achy, even in parts of your body where you did not have surgery.  Follow these instructions at home: For at least 24 hours after the procedure:  Do not: ? Participate in activities where you could fall or become injured. ? Drive. ? Use heavy machinery. ? Drink alcohol. ? Take sleeping pills or medicines that cause drowsiness. ? Make important decisions or sign legal documents. ? Take care of children on your own.  Rest. Eating and drinking  If you vomit, drink water, juice, or soup when you can drink without vomiting.  Drink enough fluid to keep your urine clear or pale yellow.  Make sure you have little or no nausea before eating solid foods.  Follow the diet recommended by your health care provider. General instructions  Have a responsible adult stay with you until you are awake and alert.  Return to your normal activities as told by your health care provider. Ask your  health care provider what activities are safe for you.  Take over-the-counter and prescription medicines only as told by your health care provider.  If you smoke, do not smoke without supervision.  Keep all follow-up visits as told by your health care provider. This is important. Contact a health care provider if:  You continue to have nausea or vomiting at home, and medicines are not helpful.  You cannot drink fluids or start eating again.  You cannot urinate after 8-12 hours.  You develop a skin rash.  You have fever.  You have increasing redness at the site of your procedure. Get help right away if:  You have difficulty breathing.  You have chest pain.  You have unexpected bleeding.  You feel that you are having a life-threatening or urgent problem. This information is not intended to replace advice given to you by your health care provider. Make sure you discuss any questions you have with your health care provider. Document Released: 12/01/2000 Document Revised: 01/28/2016 Document Reviewed: 08/09/2015 Elsevier Interactive Patient Education  [employer].

## 2017-12-03 NOTE — Anesthesia Preprocedure Evaluation (Signed)
Anesthesia Evaluation  Patient identified by MRN, date of birth, ID band Patient awake    Reviewed: Allergy & Precautions, H&P , NPO status , Patient's Chart, lab work & pertinent test results  Airway Mallampati: II  TM Distance: >3 FB Neck ROM: full    Dental no notable dental hx.    Pulmonary neg pulmonary ROS,    Pulmonary exam normal        Cardiovascular negative cardio ROS Normal cardiovascular exam     Neuro/Psych    GI/Hepatic negative GI ROS, Neg liver ROS,   Endo/Other  negative endocrine ROS  Renal/GU negative Renal ROS     Musculoskeletal   Abdominal   Peds  Hematology negative hematology ROS (+)   Anesthesia Other Findings   Reproductive/Obstetrics                             Anesthesia Physical Anesthesia Plan  ASA: I  Anesthesia Plan: General and General ETT   Post-op Pain Management: GA combined w/ Regional for post-op pain   Induction:   PONV Risk Score and Plan:   Airway Management Planned:   Additional Equipment:   Intra-op Plan:   Post-operative Plan:   Informed Consent: I have reviewed the patients History and Physical, chart, labs and discussed the procedure including the risks, benefits and alternatives for the proposed anesthesia with the patient or authorized representative who has indicated his/her understanding and acceptance.     Plan Discussed with:   Anesthesia Plan Comments:         Anesthesia Quick Evaluation

## 2017-12-03 NOTE — H&P (Signed)
Paper H&P to be scanned into permanent record. H&P reviewed. No significant changes noted.  

## 2017-12-04 ENCOUNTER — Encounter: Payer: Self-pay | Admitting: Orthopedic Surgery

## 2018-10-09 IMAGING — MR MR KNEE*R* W/O CM
6 series · 36 of 40 positions shown · non-contrast
Comparison: None.

CLINICAL DATA: Right knee pain. History of recurrent patellar
dislocations.

EXAM:
MRI OF THE RIGHT KNEE WITHOUT CONTRAST
TECHNIQUE: Multiplanar, multisequence MR imaging of the knee was performed. No
intravenous contrast was administered.

[Series 3: PD fat-sat · axial · 3.0mm · 0.50mm/px · z∈[-63,+56]mm · 8 of 37 slices shown (1 of 4)]
[im 1/37]
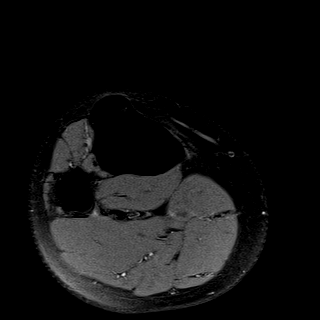
[im 6/37]
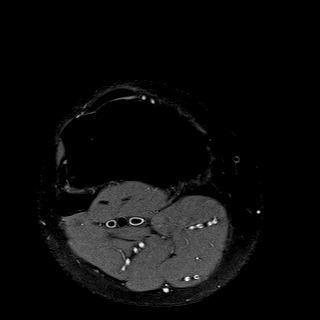
[im 11/37]
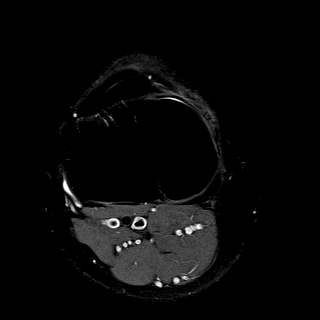
[im 16/37]
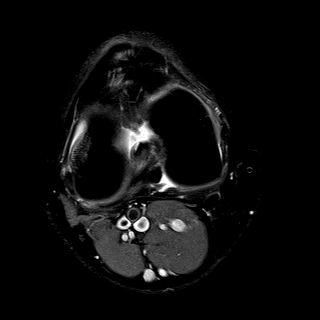
[im 21/37]
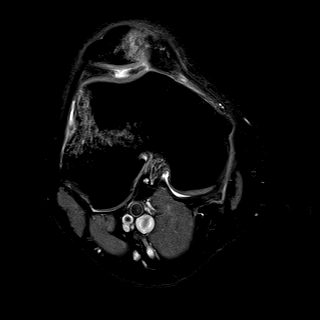
[im 26/37]
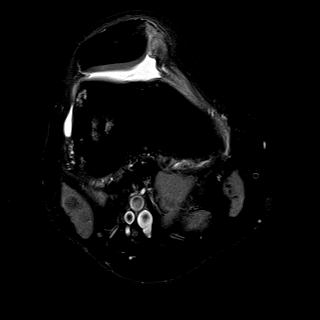
[im 31/37]
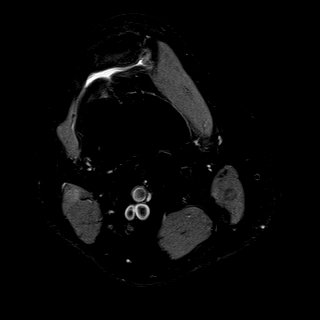
[im 37/37]
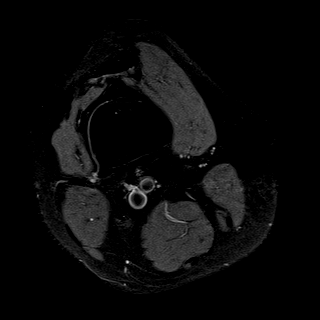

[Series 4: T1 · coronal · 3.0mm · 0.50mm/px · 3 of 33 slices shown]
[im 1/33]
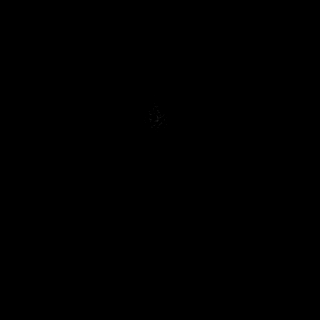
[im 6/33]
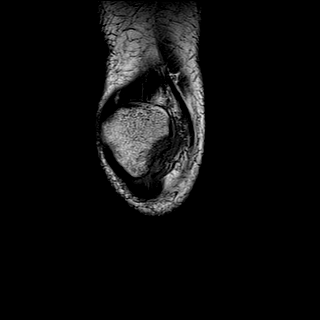
[im 11/33]
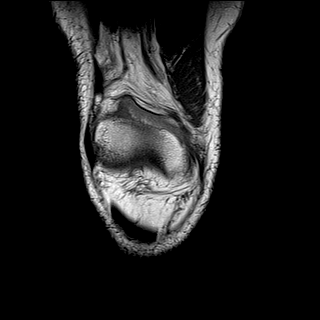

[Series 6: PD fat-sat · sagittal · 3.0mm · 0.50mm/px · 7 of 30 slices shown (2 of 4)]
[im 1/30]
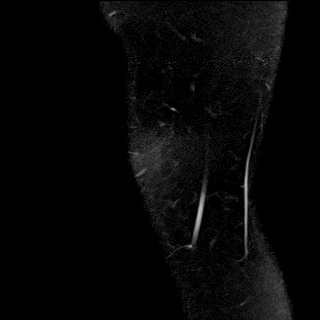
[im 5/30]
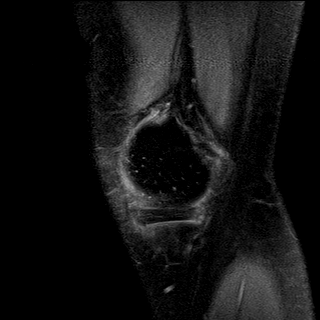
[im 10/30]
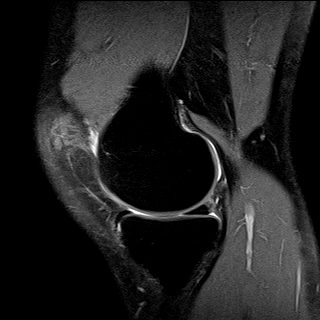
[im 15/30]
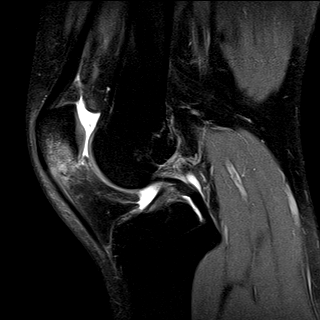
[im 20/30]
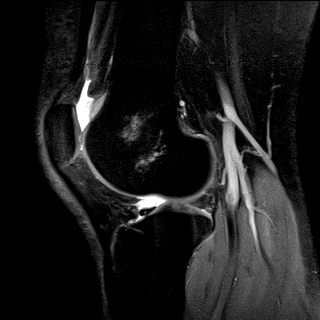
[im 25/30]
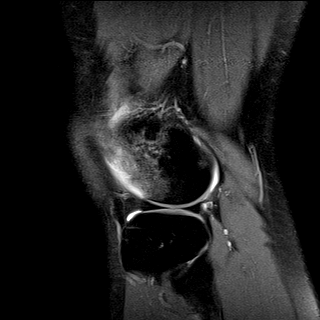
[im 30/30]
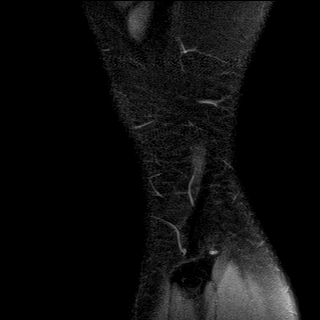

[Series 7: PD fat-sat · coronal · 3.0mm · 0.50mm/px · 7 of 33 slices shown (3 of 4)]
[im 1/33]
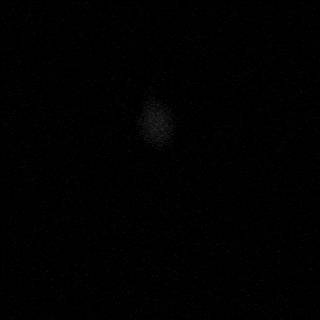
[im 6/33]
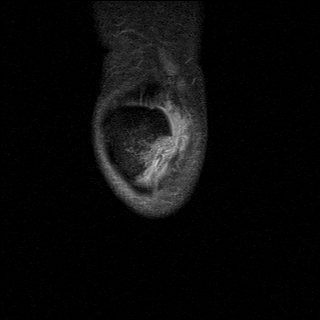
[im 11/33]
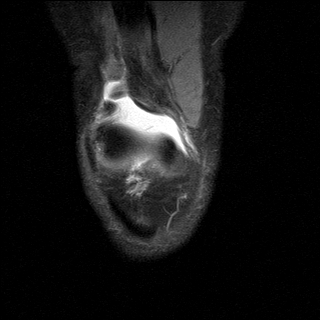
[im 17/33]
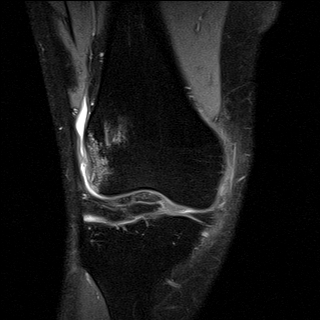
[im 22/33]
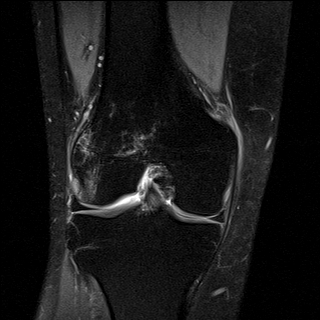
[im 27/33]
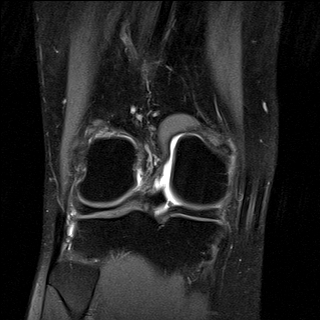
[im 33/33]
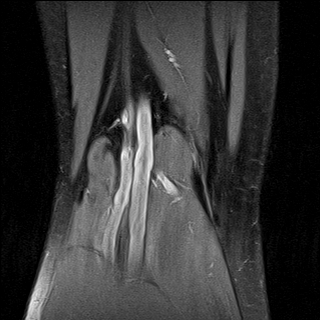

[Series 8: PD fat-sat · coronal · 2.0mm · 0.62mm/px · 4 of 17 slices shown (4 of 4)]
[im 1/17]
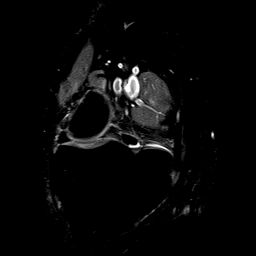
[im 6/17]
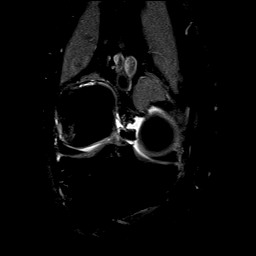
[im 11/17]
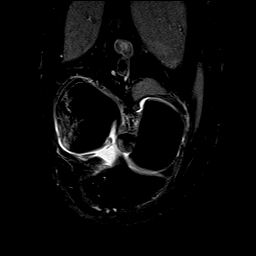
[im 17/17]
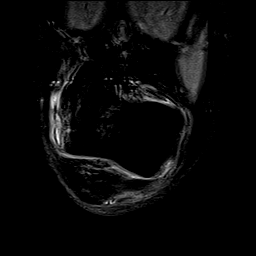

[Series 9: T2 fat-sat · coronal · 3.0mm · 0.31mm/px · 7 of 33 slices shown]
[im 1/33]
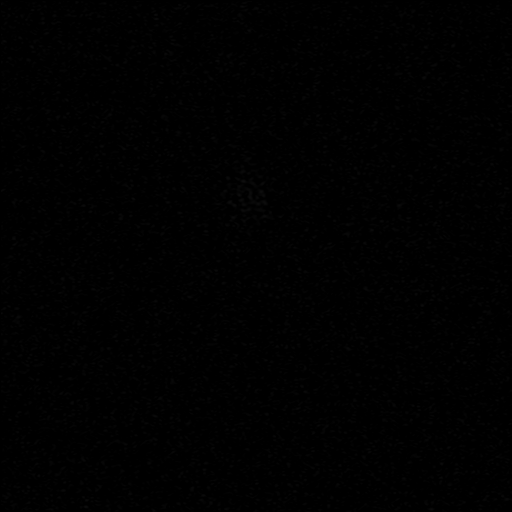
[im 6/33]
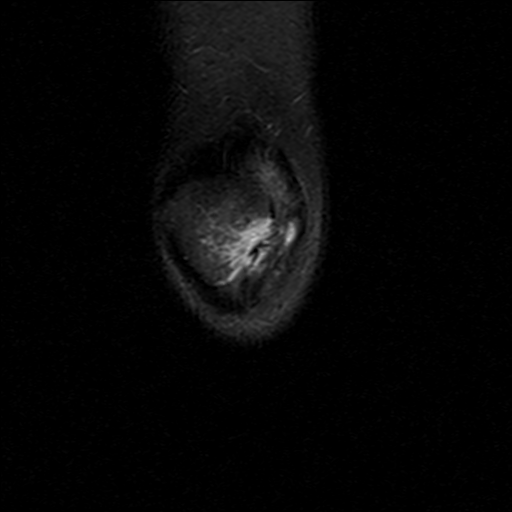
[im 11/33]
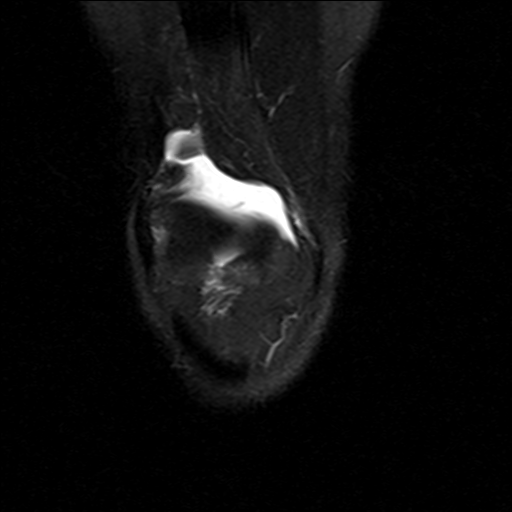
[im 17/33]
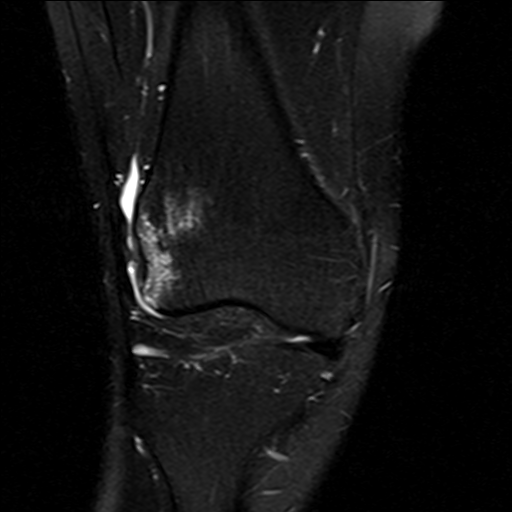
[im 22/33]
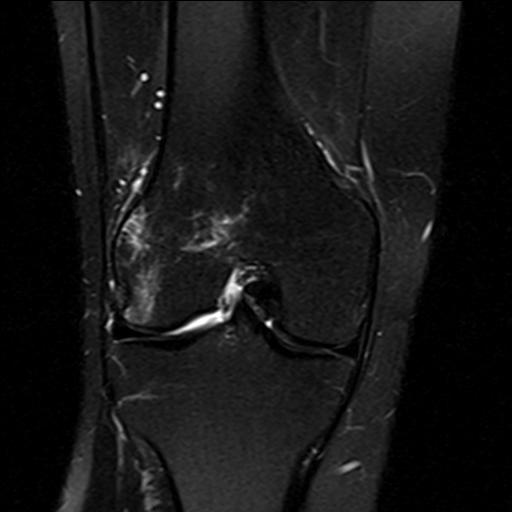
[im 27/33]
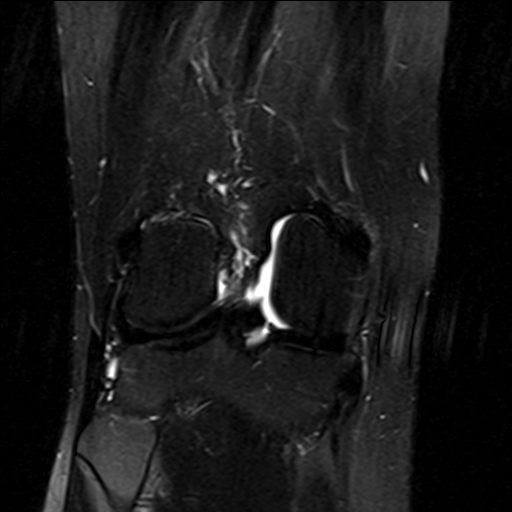
[im 33/33]
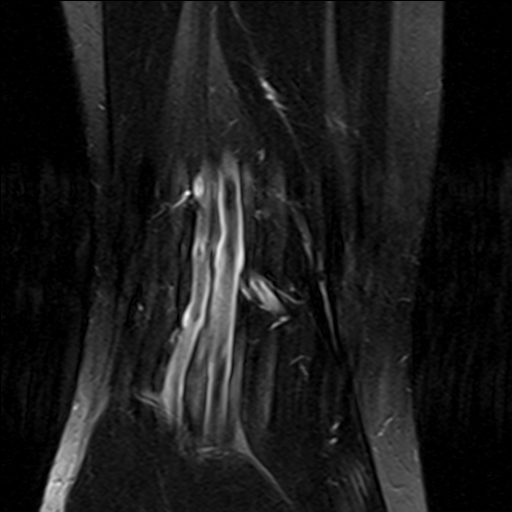

[36 of 40 positions shown; findings below may reference images not displayed]

FINDINGS: MENISCI

Medial meniscus:  Intact.

Lateral meniscus:  Intact.

LIGAMENTS

Cruciates:  Intact.

Collaterals:  Intact.

CARTILAGE

Patellofemoral:  Preserved.

Medial:  Preserved.

Lateral:  Preserved.

Joint:  Small effusion.

Popliteal Fossa:  No Baker's cyst.

Extensor Mechanism: The patient has an impaction fracture in the
lateral femoral condyle with underlying moderate subchondral edema.
Marrow edema is seen in the medial patella. The appearance is most
consistent with acute or subacute on chronic transient patellar
dislocations. The medial patellofemoral ligament is completely torn
from the patella. There is some soft tissue thickening and edema
about the MPFL at the patella compatible with granulation
tissue/scar. The patient has an extremely diminutive medial patellar
facet and shallow trochlear groove. TT-TG distance is normal at
cm. The quadriceps and patellar tendons are intact. The lateral
retinaculum and vastus medialis appear normal. There is some edema
in Hoffa's fat off the inferior pole of the medial patellar facet.

Bones:  As described above.  Otherwise negative.

Other: None.
IMPRESSION: Findings consistent with acute or subacute on chronic transient
lateral dislocation of the patella. The MPFL is completely torn from
the patella. Extremely shallow trochlear groove and diminutive
medial patellar facet predispose to dislocation. TT-TG distance is
normal.

## 2019-06-23 DIAGNOSIS — F418 Other specified anxiety disorders: Secondary | ICD-10-CM | POA: Diagnosis not present

## 2019-06-23 DIAGNOSIS — J019 Acute sinusitis, unspecified: Secondary | ICD-10-CM | POA: Diagnosis not present

## 2021-03-20 DIAGNOSIS — R1110 Vomiting, unspecified: Secondary | ICD-10-CM | POA: Diagnosis not present

## 2021-03-20 DIAGNOSIS — K219 Gastro-esophageal reflux disease without esophagitis: Secondary | ICD-10-CM | POA: Diagnosis not present

## 2021-03-25 DIAGNOSIS — R1111 Vomiting without nausea: Secondary | ICD-10-CM | POA: Diagnosis not present

## 2021-03-25 DIAGNOSIS — R6881 Early satiety: Secondary | ICD-10-CM | POA: Diagnosis not present

## 2021-03-25 DIAGNOSIS — K219 Gastro-esophageal reflux disease without esophagitis: Secondary | ICD-10-CM | POA: Diagnosis not present

## 2021-03-25 DIAGNOSIS — R1310 Dysphagia, unspecified: Secondary | ICD-10-CM | POA: Diagnosis not present

## 2021-05-01 DIAGNOSIS — R12 Heartburn: Secondary | ICD-10-CM | POA: Diagnosis not present

## 2021-05-01 DIAGNOSIS — K2930 Chronic superficial gastritis without bleeding: Secondary | ICD-10-CM | POA: Diagnosis not present

## 2021-05-01 DIAGNOSIS — R1310 Dysphagia, unspecified: Secondary | ICD-10-CM | POA: Diagnosis not present

## 2021-05-01 DIAGNOSIS — K3189 Other diseases of stomach and duodenum: Secondary | ICD-10-CM | POA: Diagnosis not present

## 2021-05-01 DIAGNOSIS — R112 Nausea with vomiting, unspecified: Secondary | ICD-10-CM | POA: Diagnosis not present

## 2021-05-01 DIAGNOSIS — K222 Esophageal obstruction: Secondary | ICD-10-CM | POA: Diagnosis not present

## 2021-05-01 DIAGNOSIS — K2289 Other specified disease of esophagus: Secondary | ICD-10-CM | POA: Diagnosis not present

## 2021-05-01 DIAGNOSIS — K219 Gastro-esophageal reflux disease without esophagitis: Secondary | ICD-10-CM | POA: Diagnosis not present

## 2021-05-28 DIAGNOSIS — K222 Esophageal obstruction: Secondary | ICD-10-CM | POA: Diagnosis not present

## 2021-05-28 DIAGNOSIS — K219 Gastro-esophageal reflux disease without esophagitis: Secondary | ICD-10-CM | POA: Diagnosis not present

## 2021-05-28 DIAGNOSIS — R112 Nausea with vomiting, unspecified: Secondary | ICD-10-CM | POA: Diagnosis not present

## 2021-09-17 DIAGNOSIS — D485 Neoplasm of uncertain behavior of skin: Secondary | ICD-10-CM | POA: Diagnosis not present

## 2021-09-17 DIAGNOSIS — L728 Other follicular cysts of the skin and subcutaneous tissue: Secondary | ICD-10-CM | POA: Diagnosis not present

## 2021-09-17 DIAGNOSIS — Z789 Other specified health status: Secondary | ICD-10-CM | POA: Diagnosis not present

## 2021-09-17 DIAGNOSIS — D224 Melanocytic nevi of scalp and neck: Secondary | ICD-10-CM | POA: Diagnosis not present

## 2022-02-20 DIAGNOSIS — T63441A Toxic effect of venom of bees, accidental (unintentional), initial encounter: Secondary | ICD-10-CM | POA: Diagnosis not present

## 2022-02-20 DIAGNOSIS — L539 Erythematous condition, unspecified: Secondary | ICD-10-CM | POA: Diagnosis not present

## 2022-08-28 DIAGNOSIS — B356 Tinea cruris: Secondary | ICD-10-CM | POA: Diagnosis not present

## 2022-08-28 DIAGNOSIS — K219 Gastro-esophageal reflux disease without esophagitis: Secondary | ICD-10-CM | POA: Diagnosis not present

## 2023-05-19 DIAGNOSIS — F411 Generalized anxiety disorder: Secondary | ICD-10-CM | POA: Diagnosis not present

## 2023-05-19 DIAGNOSIS — Z6829 Body mass index (BMI) 29.0-29.9, adult: Secondary | ICD-10-CM | POA: Diagnosis not present

## 2023-05-19 DIAGNOSIS — F5101 Primary insomnia: Secondary | ICD-10-CM | POA: Diagnosis not present

## 2023-05-19 DIAGNOSIS — I10 Essential (primary) hypertension: Secondary | ICD-10-CM | POA: Diagnosis not present

## 2023-05-19 DIAGNOSIS — J3089 Other allergic rhinitis: Secondary | ICD-10-CM | POA: Diagnosis not present

## 2023-06-16 DIAGNOSIS — E559 Vitamin D deficiency, unspecified: Secondary | ICD-10-CM | POA: Diagnosis not present

## 2023-06-16 DIAGNOSIS — Z23 Encounter for immunization: Secondary | ICD-10-CM | POA: Diagnosis not present

## 2023-06-16 DIAGNOSIS — Z0001 Encounter for general adult medical examination with abnormal findings: Secondary | ICD-10-CM | POA: Diagnosis not present

## 2023-06-16 DIAGNOSIS — D519 Vitamin B12 deficiency anemia, unspecified: Secondary | ICD-10-CM | POA: Diagnosis not present

## 2023-06-16 DIAGNOSIS — R739 Hyperglycemia, unspecified: Secondary | ICD-10-CM | POA: Diagnosis not present

## 2023-06-16 DIAGNOSIS — R5383 Other fatigue: Secondary | ICD-10-CM | POA: Diagnosis not present

## 2023-06-16 DIAGNOSIS — E782 Mixed hyperlipidemia: Secondary | ICD-10-CM | POA: Diagnosis not present

## 2023-06-16 DIAGNOSIS — Z6829 Body mass index (BMI) 29.0-29.9, adult: Secondary | ICD-10-CM | POA: Diagnosis not present

## 2023-06-16 DIAGNOSIS — I10 Essential (primary) hypertension: Secondary | ICD-10-CM | POA: Diagnosis not present

## 2023-07-14 DIAGNOSIS — F5101 Primary insomnia: Secondary | ICD-10-CM | POA: Diagnosis not present

## 2023-07-14 DIAGNOSIS — I10 Essential (primary) hypertension: Secondary | ICD-10-CM | POA: Diagnosis not present

## 2023-07-14 DIAGNOSIS — F411 Generalized anxiety disorder: Secondary | ICD-10-CM | POA: Diagnosis not present

## 2023-07-14 DIAGNOSIS — F902 Attention-deficit hyperactivity disorder, combined type: Secondary | ICD-10-CM | POA: Diagnosis not present

## 2023-08-11 DIAGNOSIS — I10 Essential (primary) hypertension: Secondary | ICD-10-CM | POA: Diagnosis not present

## 2023-08-19 DIAGNOSIS — M542 Cervicalgia: Secondary | ICD-10-CM | POA: Diagnosis not present

## 2023-08-19 DIAGNOSIS — M9902 Segmental and somatic dysfunction of thoracic region: Secondary | ICD-10-CM | POA: Diagnosis not present

## 2023-08-19 DIAGNOSIS — M9901 Segmental and somatic dysfunction of cervical region: Secondary | ICD-10-CM | POA: Diagnosis not present

## 2023-08-19 DIAGNOSIS — M9903 Segmental and somatic dysfunction of lumbar region: Secondary | ICD-10-CM | POA: Diagnosis not present

## 2023-08-21 DIAGNOSIS — M542 Cervicalgia: Secondary | ICD-10-CM | POA: Diagnosis not present

## 2023-08-25 DIAGNOSIS — K648 Other hemorrhoids: Secondary | ICD-10-CM | POA: Diagnosis not present

## 2023-08-28 DIAGNOSIS — K611 Rectal abscess: Secondary | ICD-10-CM | POA: Diagnosis not present

## 2023-09-04 DIAGNOSIS — Z1331 Encounter for screening for depression: Secondary | ICD-10-CM | POA: Diagnosis not present

## 2023-09-04 DIAGNOSIS — K611 Rectal abscess: Secondary | ICD-10-CM | POA: Diagnosis not present
